# Patient Record
Sex: Female | Born: 1935 | Race: White | Hispanic: No | Marital: Married | Smoking: Never smoker
Health system: Southern US, Community
[De-identification: ages and names within clinical notes are randomized; demographics above are authoritative.]

## PROBLEM LIST (undated history)

## (undated) DIAGNOSIS — C50919 Malignant neoplasm of unspecified site of unspecified female breast: Secondary | ICD-10-CM

## (undated) DIAGNOSIS — E079 Disorder of thyroid, unspecified: Secondary | ICD-10-CM

## (undated) DIAGNOSIS — I82409 Acute embolism and thrombosis of unspecified deep veins of unspecified lower extremity: Secondary | ICD-10-CM

## (undated) DIAGNOSIS — Z853 Personal history of malignant neoplasm of breast: Secondary | ICD-10-CM

## (undated) DIAGNOSIS — M1990 Unspecified osteoarthritis, unspecified site: Secondary | ICD-10-CM

## (undated) DIAGNOSIS — I10 Essential (primary) hypertension: Secondary | ICD-10-CM

## (undated) DIAGNOSIS — I2699 Other pulmonary embolism without acute cor pulmonale: Secondary | ICD-10-CM

## (undated) DIAGNOSIS — I4891 Unspecified atrial fibrillation: Secondary | ICD-10-CM

## (undated) DIAGNOSIS — C801 Malignant (primary) neoplasm, unspecified: Secondary | ICD-10-CM

## (undated) DIAGNOSIS — C50419 Malignant neoplasm of upper-outer quadrant of unspecified female breast: Secondary | ICD-10-CM

## (undated) DIAGNOSIS — F0390 Unspecified dementia without behavioral disturbance: Secondary | ICD-10-CM

## (undated) DIAGNOSIS — E119 Type 2 diabetes mellitus without complications: Secondary | ICD-10-CM

## (undated) HISTORY — DX: Unspecified dementia without behavioral disturbance: F03.90

## (undated) HISTORY — DX: Essential (primary) hypertension: I10

## (undated) HISTORY — DX: Type 2 diabetes mellitus without complications: E11.9

## (undated) HISTORY — PX: APPENDECTOMY: SHX54

## (undated) HISTORY — DX: Disorder of thyroid, unspecified: E07.9

## (undated) HISTORY — DX: Malignant neoplasm of unspecified site of unspecified female breast: C50.919

## (undated) HISTORY — DX: Acute embolism and thrombosis of unspecified deep veins of unspecified lower extremity: I82.409

## (undated) HISTORY — PX: ABDOMINAL HYSTERECTOMY: SHX81

## (undated) HISTORY — DX: Malignant (primary) neoplasm, unspecified: C80.1

## (undated) HISTORY — PX: CHOLECYSTECTOMY: SHX55

## (undated) HISTORY — DX: Malignant neoplasm of upper-outer quadrant of unspecified female breast: C50.419

## (undated) HISTORY — DX: Personal history of malignant neoplasm of breast: Z85.3

## (undated) HISTORY — DX: Unspecified atrial fibrillation: I48.91

## (undated) HISTORY — DX: Other pulmonary embolism without acute cor pulmonale: I26.99

## (undated) HISTORY — DX: Unspecified osteoarthritis, unspecified site: M19.90

---

## 1987-02-12 DIAGNOSIS — C50919 Malignant neoplasm of unspecified site of unspecified female breast: Secondary | ICD-10-CM

## 1987-02-12 HISTORY — PX: BREAST BIOPSY: SHX20

## 1987-02-12 HISTORY — PX: BREAST SURGERY: SHX581

## 1987-02-12 HISTORY — DX: Malignant neoplasm of unspecified site of unspecified female breast: C50.919

## 2005-03-17 ENCOUNTER — Other Ambulatory Visit: Payer: Self-pay

## 2005-03-17 ENCOUNTER — Emergency Department: Payer: Self-pay | Admitting: Emergency Medicine

## 2005-04-24 ENCOUNTER — Ambulatory Visit: Payer: Self-pay | Admitting: Family Medicine

## 2005-06-11 ENCOUNTER — Encounter: Admission: RE | Admit: 2005-06-11 | Discharge: 2005-06-11 | Payer: Self-pay | Admitting: Neurological Surgery

## 2005-06-27 ENCOUNTER — Ambulatory Visit: Payer: Self-pay | Admitting: Pain Medicine

## 2005-07-11 ENCOUNTER — Ambulatory Visit: Payer: Self-pay | Admitting: Pain Medicine

## 2005-07-16 ENCOUNTER — Ambulatory Visit: Payer: Self-pay | Admitting: Cardiology

## 2005-07-25 ENCOUNTER — Ambulatory Visit: Payer: Self-pay | Admitting: Pain Medicine

## 2005-09-19 ENCOUNTER — Inpatient Hospital Stay: Payer: Self-pay | Admitting: Orthopedic Surgery

## 2005-12-18 ENCOUNTER — Ambulatory Visit: Payer: Self-pay | Admitting: Pain Medicine

## 2005-12-19 ENCOUNTER — Ambulatory Visit: Payer: Self-pay | Admitting: Pain Medicine

## 2006-01-07 ENCOUNTER — Ambulatory Visit: Payer: Self-pay | Admitting: Physician Assistant

## 2006-01-21 ENCOUNTER — Ambulatory Visit: Payer: Self-pay | Admitting: Pain Medicine

## 2006-02-16 ENCOUNTER — Inpatient Hospital Stay: Payer: Self-pay | Admitting: Internal Medicine

## 2006-02-16 ENCOUNTER — Other Ambulatory Visit: Payer: Self-pay

## 2006-03-01 ENCOUNTER — Emergency Department: Payer: Self-pay | Admitting: Internal Medicine

## 2006-10-17 ENCOUNTER — Ambulatory Visit: Payer: Self-pay | Admitting: Neurology

## 2006-12-08 ENCOUNTER — Ambulatory Visit: Payer: Self-pay | Admitting: Family Medicine

## 2007-02-07 ENCOUNTER — Observation Stay: Payer: Self-pay | Admitting: Internal Medicine

## 2007-06-15 ENCOUNTER — Emergency Department: Payer: Self-pay | Admitting: Emergency Medicine

## 2007-06-28 ENCOUNTER — Observation Stay: Payer: Self-pay | Admitting: Internal Medicine

## 2007-06-28 ENCOUNTER — Other Ambulatory Visit: Payer: Self-pay

## 2008-09-19 ENCOUNTER — Ambulatory Visit: Payer: Self-pay | Admitting: Family Medicine

## 2009-02-11 DIAGNOSIS — C801 Malignant (primary) neoplasm, unspecified: Secondary | ICD-10-CM

## 2009-02-11 DIAGNOSIS — F0390 Unspecified dementia without behavioral disturbance: Secondary | ICD-10-CM

## 2009-02-11 HISTORY — PX: BREAST SURGERY: SHX581

## 2009-02-11 HISTORY — DX: Malignant (primary) neoplasm, unspecified: C80.1

## 2009-02-11 HISTORY — DX: Unspecified dementia, unspecified severity, without behavioral disturbance, psychotic disturbance, mood disturbance, and anxiety: F03.90

## 2009-05-30 ENCOUNTER — Emergency Department: Payer: Self-pay | Admitting: Emergency Medicine

## 2009-07-17 ENCOUNTER — Ambulatory Visit: Payer: Self-pay | Admitting: Medical

## 2009-08-22 ENCOUNTER — Emergency Department: Payer: Self-pay | Admitting: Emergency Medicine

## 2009-08-31 ENCOUNTER — Inpatient Hospital Stay: Payer: Self-pay | Admitting: General Surgery

## 2009-09-04 LAB — PATHOLOGY REPORT

## 2009-11-25 ENCOUNTER — Emergency Department: Payer: Self-pay | Admitting: Emergency Medicine

## 2009-12-02 ENCOUNTER — Emergency Department: Payer: Self-pay | Admitting: Internal Medicine

## 2009-12-04 ENCOUNTER — Emergency Department: Payer: Self-pay | Admitting: Emergency Medicine

## 2009-12-05 ENCOUNTER — Emergency Department: Payer: Self-pay | Admitting: Emergency Medicine

## 2010-01-31 ENCOUNTER — Emergency Department: Payer: Self-pay | Admitting: Emergency Medicine

## 2010-03-15 ENCOUNTER — Emergency Department: Payer: Self-pay | Admitting: Unknown Physician Specialty

## 2010-06-01 ENCOUNTER — Emergency Department: Payer: Self-pay | Admitting: *Deleted

## 2010-06-10 ENCOUNTER — Emergency Department: Payer: Self-pay | Admitting: Emergency Medicine

## 2010-07-28 ENCOUNTER — Emergency Department: Payer: Self-pay | Admitting: Emergency Medicine

## 2010-08-30 ENCOUNTER — Emergency Department: Payer: Self-pay | Admitting: Emergency Medicine

## 2010-09-19 ENCOUNTER — Emergency Department: Payer: Self-pay | Admitting: Emergency Medicine

## 2010-10-19 ENCOUNTER — Emergency Department: Payer: Self-pay | Admitting: Emergency Medicine

## 2011-06-15 ENCOUNTER — Emergency Department: Payer: Self-pay | Admitting: Emergency Medicine

## 2011-06-15 LAB — BASIC METABOLIC PANEL
BUN: 23 mg/dL — ABNORMAL HIGH (ref 7–18)
Calcium, Total: 8.4 mg/dL — ABNORMAL LOW (ref 8.5–10.1)
Creatinine: 0.91 mg/dL (ref 0.60–1.30)
EGFR (African American): >60
Glucose: 97 mg/dL (ref 65–99)
Osmolality: 290 (ref 275–301)
Potassium: 4.7 mmol/L (ref 3.5–5.1)

## 2011-06-15 LAB — CBC
HGB: 11.4 g/dL — ABNORMAL LOW (ref 12.0–16.0)
MCHC: 32.5 g/dL (ref 32.0–36.0)
MCV: 84 fL (ref 80–100)
RBC: 4.15 10*6/uL (ref 3.80–5.20)
RDW: 17 % — ABNORMAL HIGH (ref 11.5–14.5)
WBC: 4 10*3/uL (ref 3.6–11.0)

## 2011-06-15 LAB — DIGOXIN LEVEL: Digoxin: 0.98 ng/mL

## 2011-06-18 ENCOUNTER — Emergency Department: Payer: Self-pay | Admitting: Emergency Medicine

## 2011-06-30 ENCOUNTER — Emergency Department: Payer: Self-pay | Admitting: Emergency Medicine

## 2011-06-30 LAB — COMPREHENSIVE METABOLIC PANEL
Albumin: 3.3 g/dL — ABNORMAL LOW (ref 3.4–5.0)
Anion Gap: 10 (ref 7–16)
BUN: 21 mg/dL — ABNORMAL HIGH (ref 7–18)
Bilirubin,Total: 0.3 mg/dL (ref 0.2–1.0)
Chloride: 106 mmol/L (ref 98–107)
EGFR (African American): >60
Glucose: 132 mg/dL — ABNORMAL HIGH (ref 65–99)
Potassium: 4.3 mmol/L (ref 3.5–5.1)
SGPT (ALT): 19 U/L
Sodium: 144 mmol/L (ref 136–145)

## 2011-06-30 LAB — CBC
MCHC: 33.2 g/dL (ref 32.0–36.0)
Platelet: 135 10*3/uL — ABNORMAL LOW (ref 150–440)
RBC: 4.17 10*6/uL (ref 3.80–5.20)
RDW: 16.5 % — ABNORMAL HIGH (ref 11.5–14.5)

## 2011-06-30 LAB — CK TOTAL AND CKMB (NOT AT ARMC): CK-MB: 0.6 ng/mL (ref 0.5–3.6)

## 2011-12-10 IMAGING — CR DG HUMERUS 2V *L*
1 series · 2 of 2 positions shown · non-contrast
Comparison: none

REASON FOR EXAM: fall
COMMENTS:

PROCEDURE:     DXR - DXR HUMERUS LEFT  - December 02, 2009  [DATE]
RESULT:     There is no evidence of fracture, dislocation, or malalignment.

[Series 1: view not recorded · 0.17mm/px · 2 of 2 slices shown]
[im 1/2]
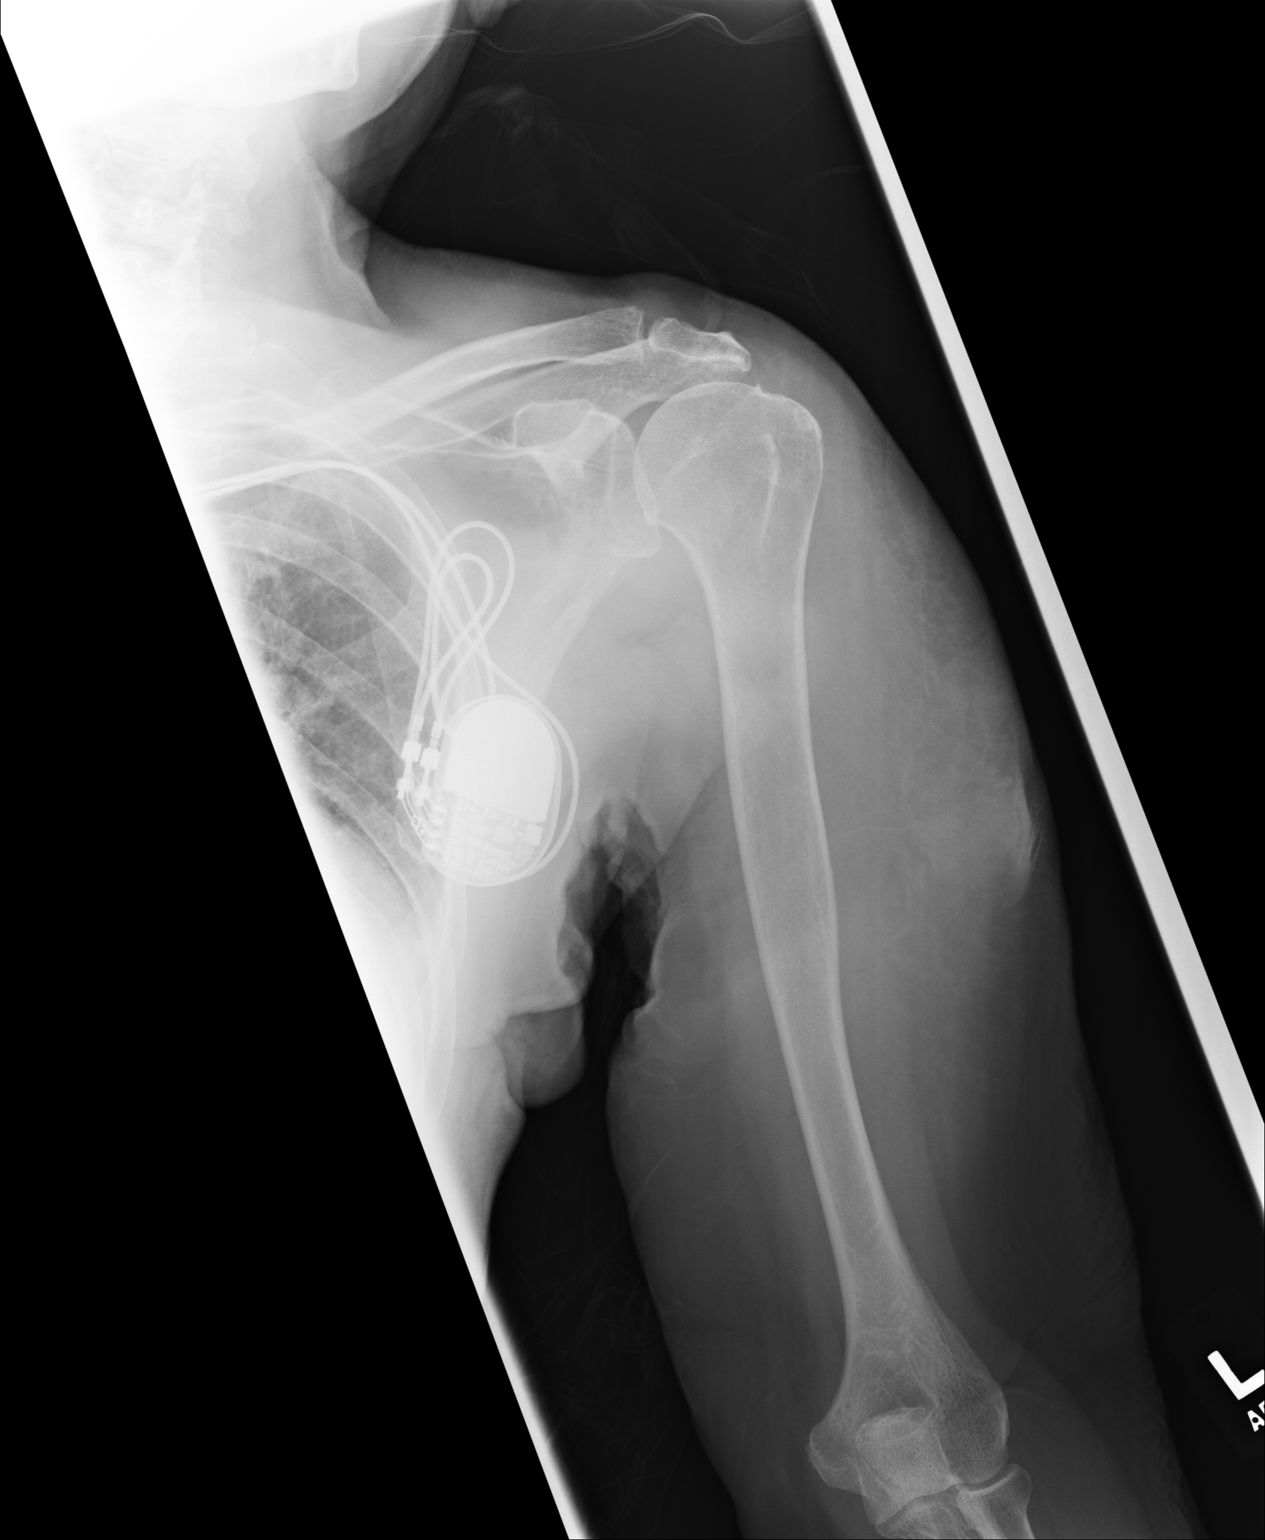
[im 2/2]
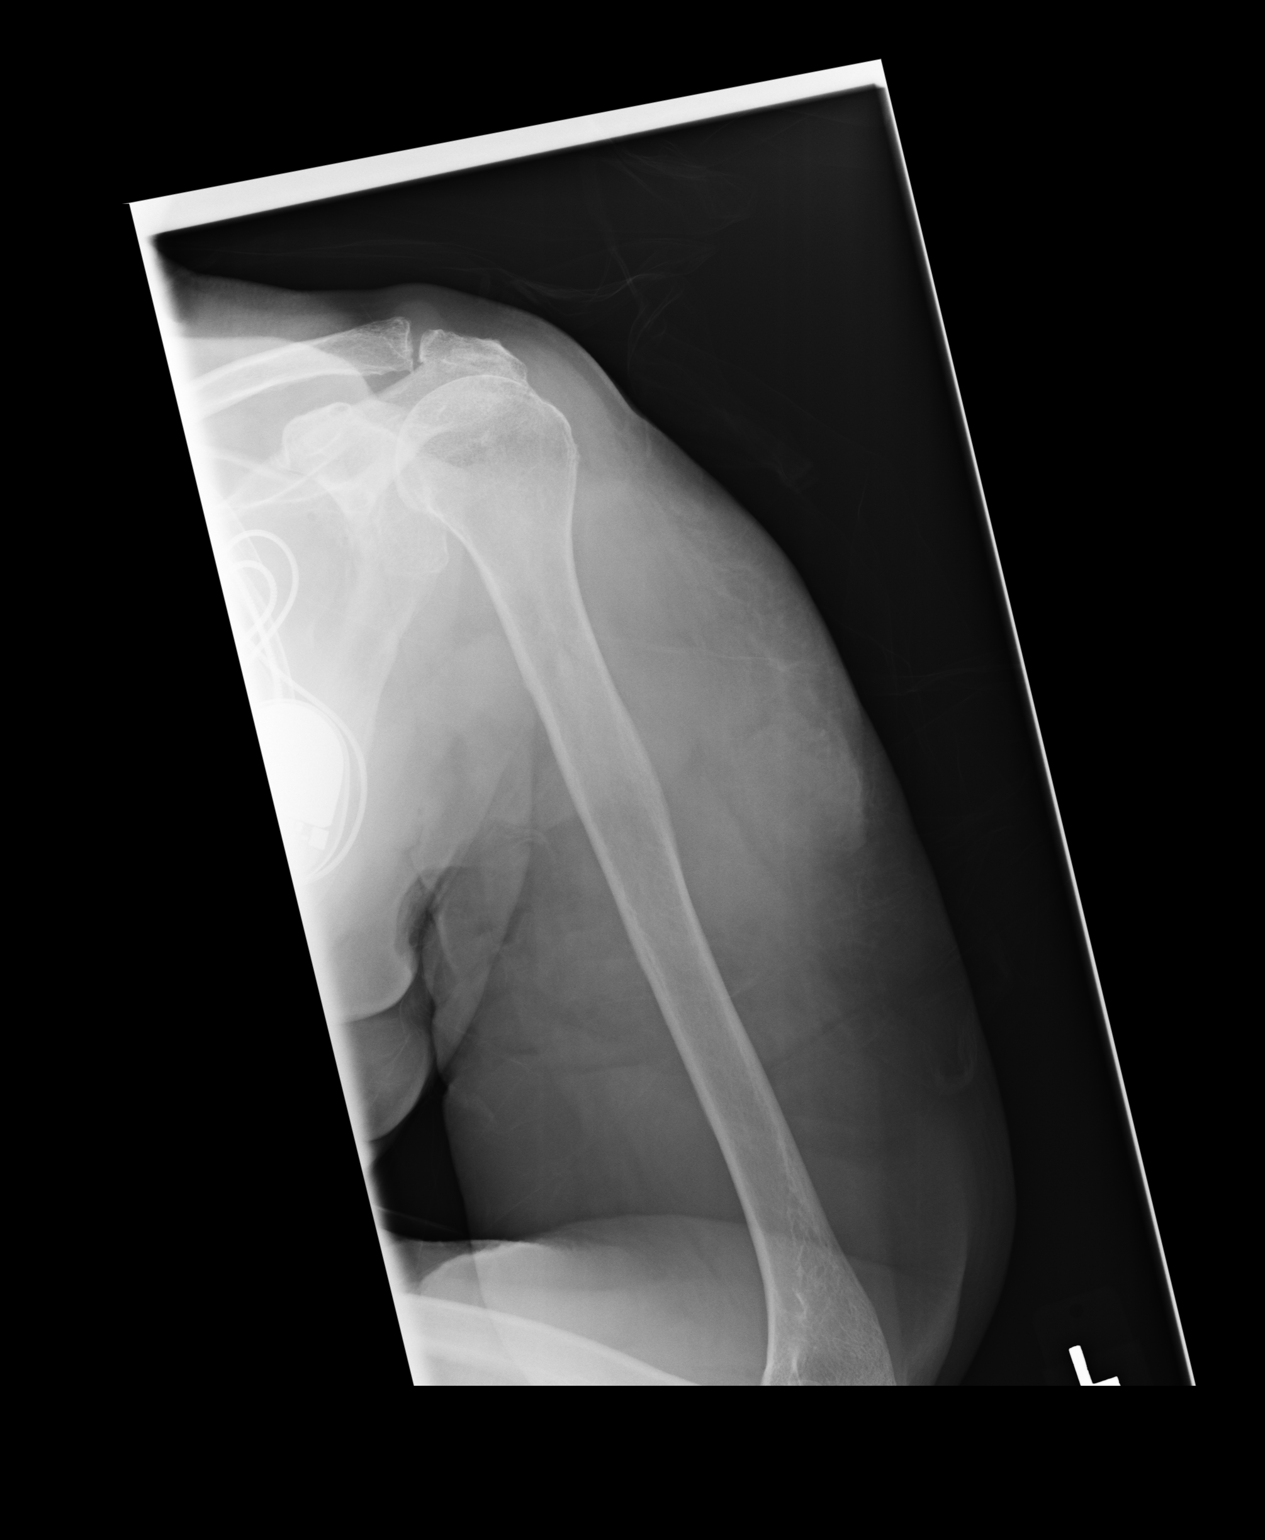

[2 of 2 positions shown; findings below may reference images not displayed]

IMPRESSION: 1. No evidence of acute abnormalities.
2. If there are persistent complaints of pain or persistent clinical
concern, a repeat evaluation in 7-10 days is recommended if clinically
warranted.

## 2011-12-12 IMAGING — CR DG HUMERUS 2V *L*
1 series · 2 of 2 positions shown · non-contrast
Comparison: none

REASON FOR EXAM: s/p fall eval for fx
COMMENTS:

PROCEDURE:     DXR - DXR HUMERUS LEFT  - December 04, 2009  [DATE]
RESULT:     Comparison is made to a prior exam of 12/02/2009.
No fracture, dislocation or other acute bony abnormality is identified.

[Series 1: view not recorded · 0.17mm/px · 2 of 2 slices shown]
[im 1/2]
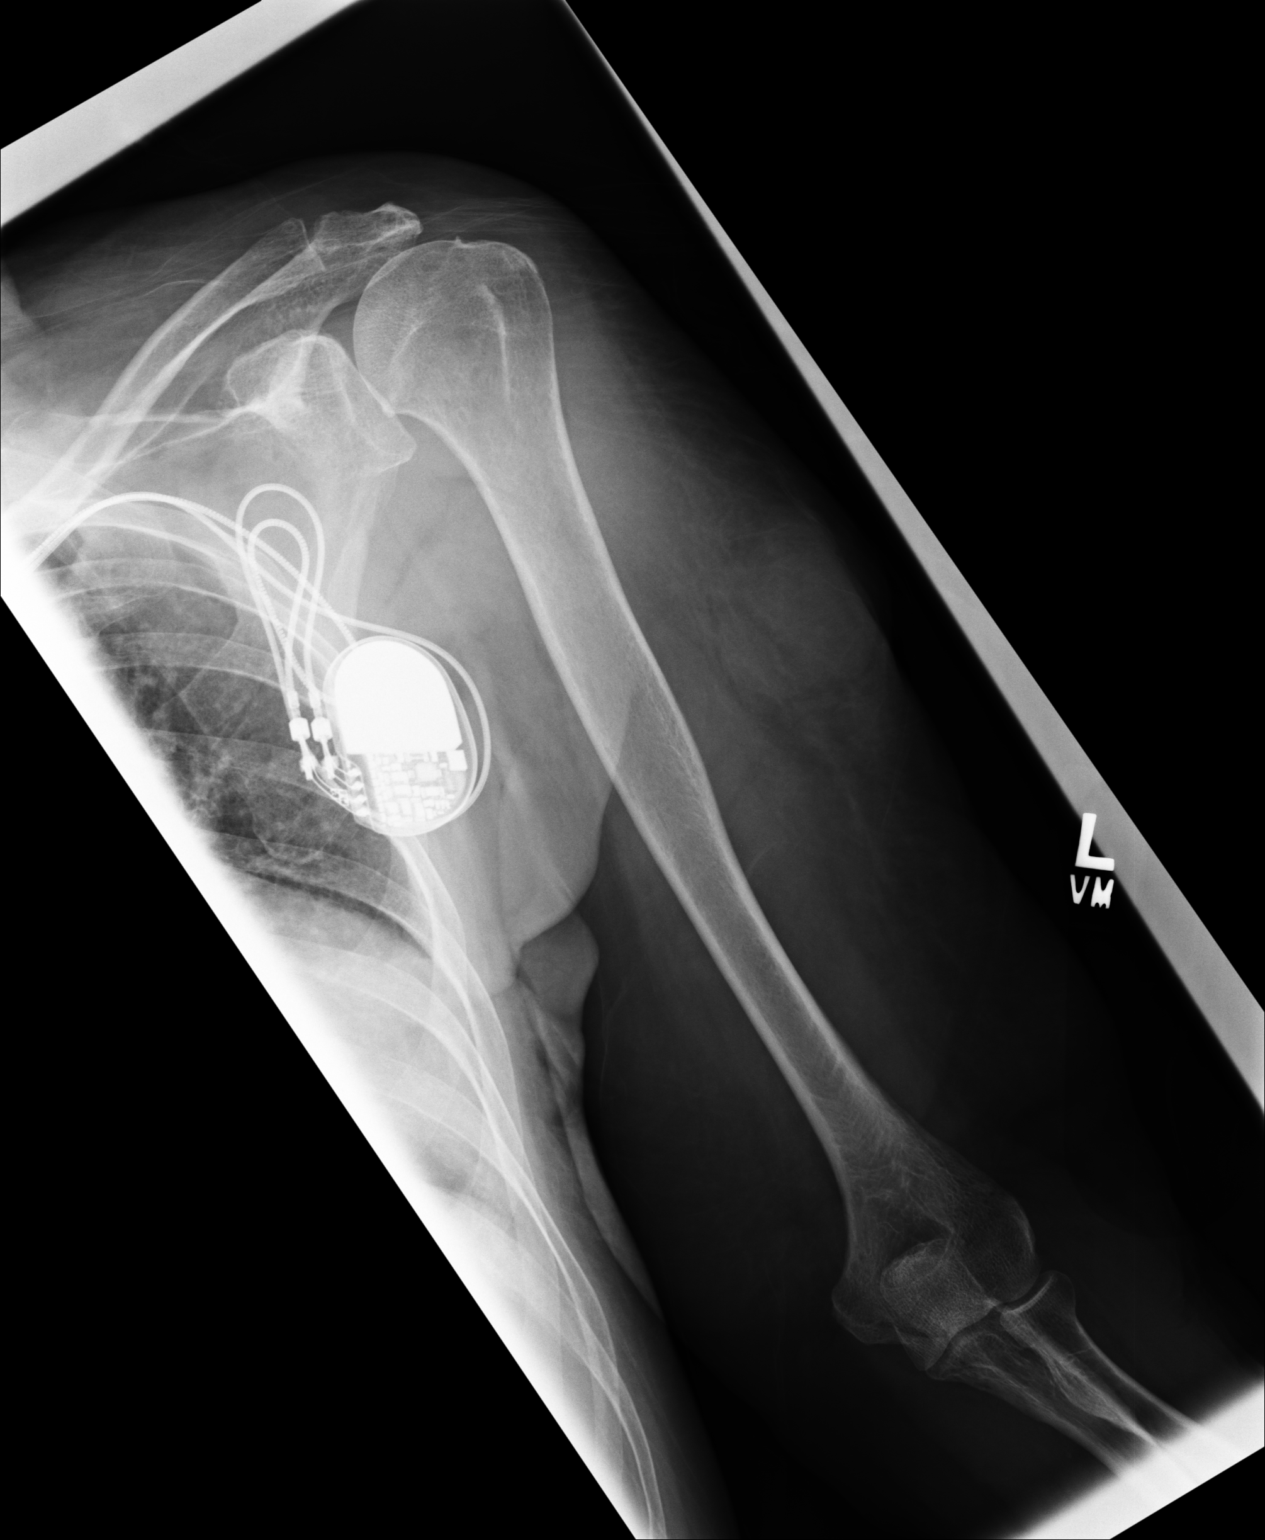
[im 2/2]
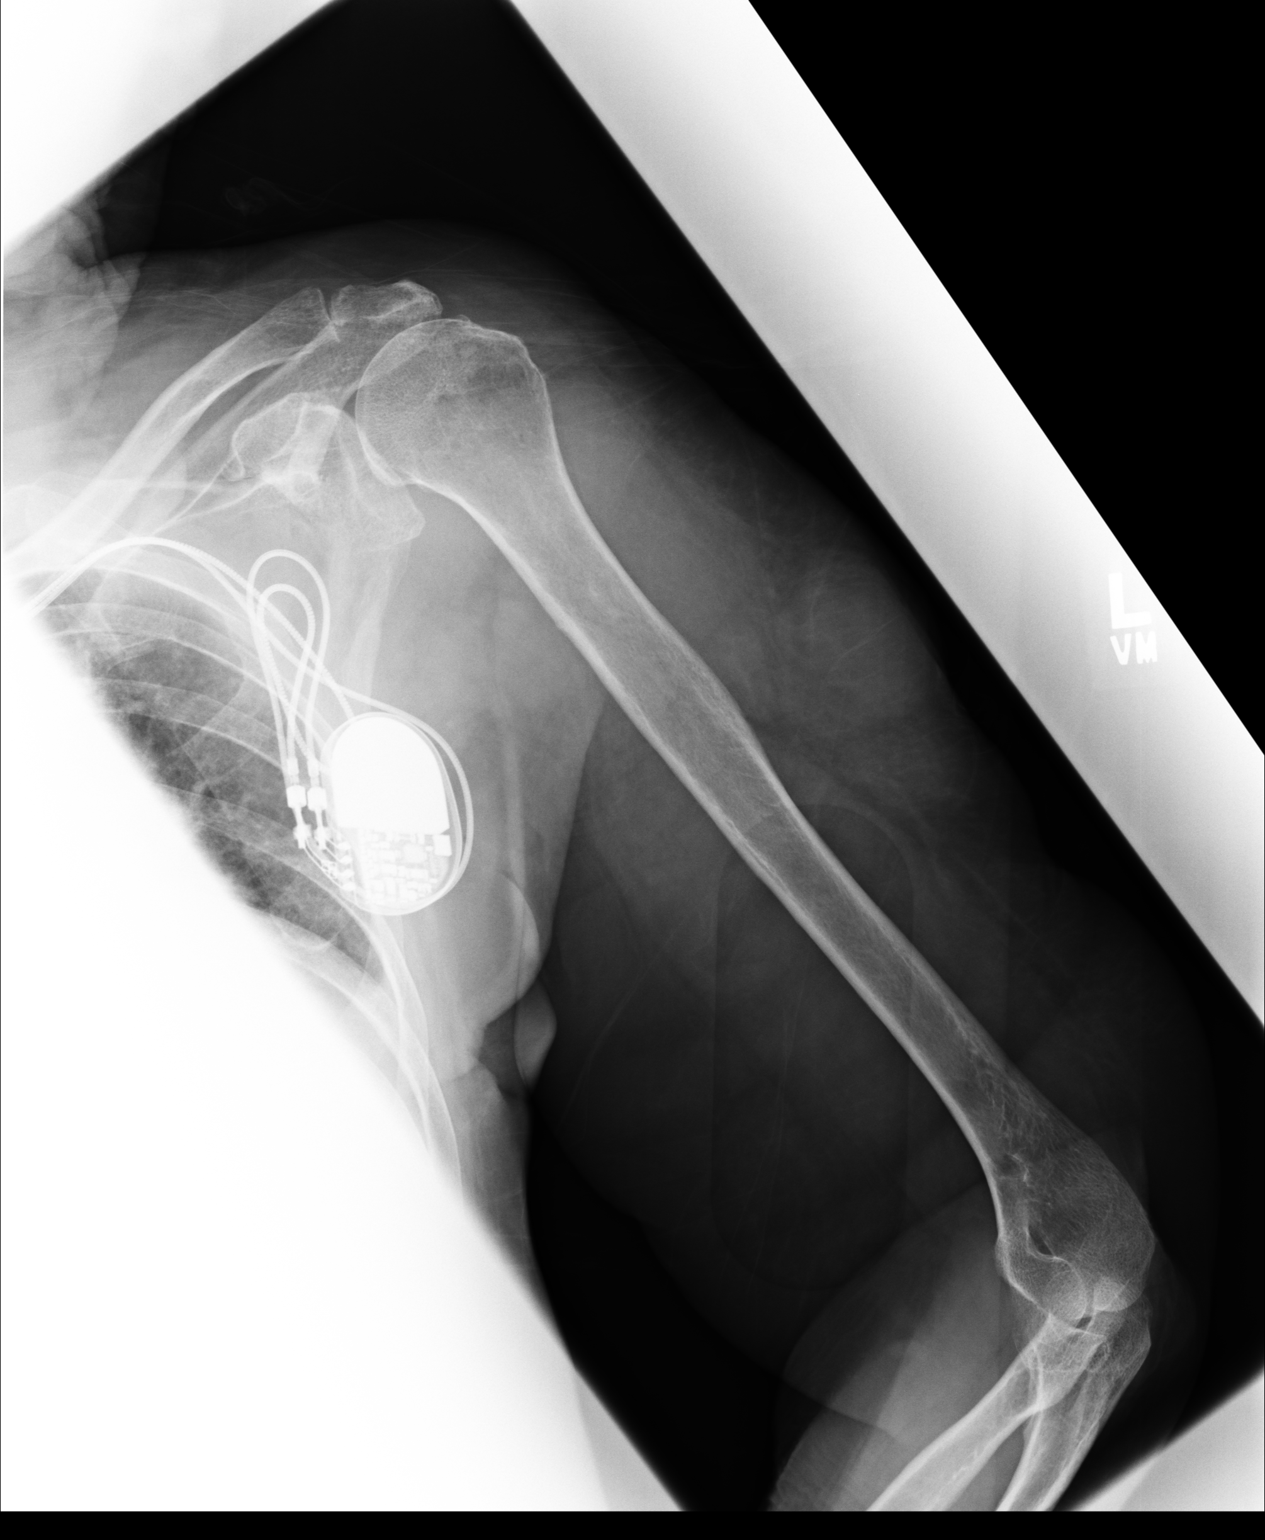

[2 of 2 positions shown; findings below may reference images not displayed]

IMPRESSION: No significant osseous abnormalities are noted.

## 2012-03-15 ENCOUNTER — Emergency Department: Payer: Self-pay | Admitting: Emergency Medicine

## 2012-03-15 LAB — URINALYSIS, COMPLETE
Blood: NEGATIVE
Glucose,UR: NEGATIVE mg/dL (ref 0–75)
Hyaline Cast: 2
Ketone: NEGATIVE
Nitrite: POSITIVE
Ph: 5 (ref 4.5–8.0)
RBC,UR: 12 /HPF (ref 0–5)
Specific Gravity: 1.017 (ref 1.003–1.030)
WBC UR: 251 /HPF (ref 0–5)

## 2012-03-15 LAB — COMPREHENSIVE METABOLIC PANEL
Albumin: 3 g/dL — ABNORMAL LOW (ref 3.4–5.0)
Bilirubin,Total: 0.3 mg/dL (ref 0.2–1.0)
Calcium, Total: 8 mg/dL — ABNORMAL LOW (ref 8.5–10.1)
Co2: 26 mmol/L (ref 21–32)
Creatinine: 0.97 mg/dL (ref 0.60–1.30)
Potassium: 4 mmol/L (ref 3.5–5.1)
Total Protein: 6.4 g/dL (ref 6.4–8.2)

## 2012-03-15 LAB — CBC
MCV: 86 fL (ref 80–100)
RDW: 16.9 % — ABNORMAL HIGH (ref 11.5–14.5)
WBC: 3.2 10*3/uL — ABNORMAL LOW (ref 3.6–11.0)

## 2012-04-30 ENCOUNTER — Emergency Department: Payer: Self-pay | Admitting: Emergency Medicine

## 2012-05-20 ENCOUNTER — Emergency Department: Payer: Self-pay | Admitting: Emergency Medicine

## 2012-05-23 ENCOUNTER — Emergency Department: Payer: Self-pay | Admitting: Emergency Medicine

## 2012-05-23 LAB — COMPREHENSIVE METABOLIC PANEL
Albumin: 3.3 g/dL — ABNORMAL LOW (ref 3.4–5.0)
Anion Gap: 7 (ref 7–16)
Bilirubin,Total: 0.2 mg/dL (ref 0.2–1.0)
Chloride: 106 mmol/L (ref 98–107)
EGFR (African American): >60
Glucose: 201 mg/dL — ABNORMAL HIGH (ref 65–99)
Osmolality: 295 (ref 275–301)
Potassium: 4.4 mmol/L (ref 3.5–5.1)
SGOT(AST): 13 U/L — ABNORMAL LOW (ref 15–37)
SGPT (ALT): 14 U/L (ref 12–78)
Total Protein: 6.8 g/dL (ref 6.4–8.2)

## 2012-05-23 LAB — DIGOXIN LEVEL: Digoxin: 0.83 ng/mL

## 2012-05-23 LAB — CBC
HCT: 38.5 % (ref 35.0–47.0)
MCH: 28.5 pg (ref 26.0–34.0)
RBC: 4.38 10*6/uL (ref 3.80–5.20)

## 2012-05-23 LAB — TROPONIN I: Troponin-I: <0.02 ng/mL

## 2012-07-09 ENCOUNTER — Telehealth: Payer: Self-pay | Admitting: *Deleted

## 2012-07-09 NOTE — Telephone Encounter (Signed)
Notified son as instructed.  He states as far as he knows she is doing "good".  States he will check with his sister as well.  Patient is at [facility] and her dementia is worse.  Appreciates our call.  Aware to call us if we can be of assistance in any way.

## 2012-07-09 NOTE — Telephone Encounter (Signed)
Message copied by Currie Paris on Thu Jul 09, 2012 10:01 AM ------      Message from: Deercroft, Merrily Pew      Created: Thu Jul 09, 2012  9:50 AM       Patient last seen July 2013, 2 years post mastectomy. Contact son and see if she is having any problems re: breast/ mastectomy. If not, no further f/u is needed. Thanks.  ------

## 2012-07-22 ENCOUNTER — Emergency Department: Payer: Self-pay | Admitting: Internal Medicine

## 2013-05-12 ENCOUNTER — Ambulatory Visit: Payer: Self-pay | Admitting: Internal Medicine

## 2013-05-28 ENCOUNTER — Inpatient Hospital Stay: Payer: Self-pay | Admitting: Specialist

## 2013-05-28 LAB — URINALYSIS, COMPLETE
BACTERIA: NONE SEEN
Bilirubin,UR: NEGATIVE
Blood: NEGATIVE
GLUCOSE, UR: NEGATIVE mg/dL (ref 0–75)
Ketone: NEGATIVE
LEUKOCYTE ESTERASE: NEGATIVE
Nitrite: NEGATIVE
Ph: 6 (ref 4.5–8.0)
Protein: NEGATIVE
RBC,UR: 4 /HPF (ref 0–5)
SPECIFIC GRAVITY: 1.024 (ref 1.003–1.030)
WBC UR: 1 /HPF (ref 0–5)

## 2013-05-28 LAB — TROPONIN I
TROPONIN-I: 0.08 ng/mL — AB
Troponin-I: 0.07 ng/mL — ABNORMAL HIGH
Troponin-I: 0.07 ng/mL — ABNORMAL HIGH
Troponin-I: 0.08 ng/mL — ABNORMAL HIGH

## 2013-05-28 LAB — CBC
HCT: 46.5 % (ref 35.0–47.0)
HGB: 14.9 g/dL (ref 12.0–16.0)
MCH: 29.6 pg (ref 26.0–34.0)
MCHC: 32.1 g/dL (ref 32.0–36.0)
MCV: 92 fL (ref 80–100)
PLATELETS: 111 10*3/uL — AB (ref 150–440)
RBC: 5.04 10*6/uL (ref 3.80–5.20)
RDW: 17.1 % — AB (ref 11.5–14.5)
WBC: 7.3 10*3/uL (ref 3.6–11.0)

## 2013-05-28 LAB — SODIUM
SODIUM: 151 mmol/L — AB (ref 136–145)
Sodium: 150 mmol/L — ABNORMAL HIGH (ref 136–145)

## 2013-05-28 LAB — COMPREHENSIVE METABOLIC PANEL
ALBUMIN: 2.9 g/dL — AB (ref 3.4–5.0)
ANION GAP: 2 — AB (ref 7–16)
AST: 72 U/L — AB (ref 15–37)
Alkaline Phosphatase: 51 U/L
BUN: 25 mg/dL — ABNORMAL HIGH (ref 7–18)
Bilirubin,Total: 0.6 mg/dL (ref 0.2–1.0)
CALCIUM: 8.5 mg/dL (ref 8.5–10.1)
CO2: 34 mmol/L — AB (ref 21–32)
CREATININE: 0.51 mg/dL — AB (ref 0.60–1.30)
Chloride: 114 mmol/L — ABNORMAL HIGH (ref 98–107)
EGFR (African American): >60
EGFR (Non-African Amer.): >60
GLUCOSE: 152 mg/dL — AB (ref 65–99)
OSMOLALITY: 305 (ref 275–301)
Potassium: 4.8 mmol/L (ref 3.5–5.1)
SGPT (ALT): 33 U/L (ref 12–78)
SODIUM: 150 mmol/L — AB (ref 136–145)
TOTAL PROTEIN: 7 g/dL (ref 6.4–8.2)

## 2013-05-28 LAB — CK-MB
CK-MB: 5.1 ng/mL — AB (ref 0.5–3.6)
CK-MB: 4.3 ng/mL — ABNORMAL HIGH (ref 0.5–3.6)

## 2013-05-28 LAB — PROTIME-INR
INR: 1.1
Prothrombin Time: 13.8 secs (ref 11.5–14.7)

## 2013-05-28 LAB — CK TOTAL AND CKMB (NOT AT ARMC)
CK, TOTAL: 251 U/L — AB
CK-MB: 5.3 ng/mL — AB (ref 0.5–3.6)

## 2013-05-29 LAB — CBC WITH DIFFERENTIAL/PLATELET
BASOS ABS: 0 10*3/uL (ref 0.0–0.1)
Basophil %: 0.8 %
EOS ABS: 0 10*3/uL (ref 0.0–0.7)
EOS PCT: 0.5 %
HCT: 40.1 % (ref 35.0–47.0)
HGB: 12.7 g/dL (ref 12.0–16.0)
LYMPHS ABS: 1.1 10*3/uL (ref 1.0–3.6)
LYMPHS PCT: 23.5 %
MCH: 29.4 pg (ref 26.0–34.0)
MCHC: 31.7 g/dL — ABNORMAL LOW (ref 32.0–36.0)
MCV: 93 fL (ref 80–100)
Monocyte #: 0.2 x10 3/mm (ref 0.2–0.9)
Monocyte %: 5.5 %
NEUTROS PCT: 69.7 %
Neutrophil #: 3.1 10*3/uL (ref 1.4–6.5)
PLATELETS: 99 10*3/uL — AB (ref 150–440)
RBC: 4.32 10*6/uL (ref 3.80–5.20)
RDW: 16.6 % — AB (ref 11.5–14.5)
WBC: 4.5 10*3/uL (ref 3.6–11.0)

## 2013-05-29 LAB — BASIC METABOLIC PANEL
ANION GAP: 6 — AB (ref 7–16)
BUN: 18 mg/dL (ref 7–18)
CALCIUM: 7.6 mg/dL — AB (ref 8.5–10.1)
CHLORIDE: 111 mmol/L — AB (ref 98–107)
Co2: 30 mmol/L (ref 21–32)
Creatinine: 0.62 mg/dL (ref 0.60–1.30)
EGFR (African American): >60
GLUCOSE: 228 mg/dL — AB (ref 65–99)
OSMOLALITY: 302 (ref 275–301)
POTASSIUM: 3 mmol/L — AB (ref 3.5–5.1)
Sodium: 147 mmol/L — ABNORMAL HIGH (ref 136–145)

## 2013-05-29 LAB — MAGNESIUM: MAGNESIUM: 2.2 mg/dL

## 2013-05-30 LAB — BASIC METABOLIC PANEL
ANION GAP: 4 — AB (ref 7–16)
BUN: 13 mg/dL (ref 7–18)
CHLORIDE: 109 mmol/L — AB (ref 98–107)
CO2: 30 mmol/L (ref 21–32)
Calcium, Total: 7.7 mg/dL — ABNORMAL LOW (ref 8.5–10.1)
Creatinine: 0.64 mg/dL (ref 0.60–1.30)
EGFR (African American): >60
GLUCOSE: 170 mg/dL — AB (ref 65–99)
OSMOLALITY: 289 (ref 275–301)
Potassium: 3.3 mmol/L — ABNORMAL LOW (ref 3.5–5.1)
SODIUM: 143 mmol/L (ref 136–145)

## 2013-06-11 ENCOUNTER — Ambulatory Visit: Payer: Self-pay | Admitting: Internal Medicine

## 2013-08-11 DEATH — deceased

## 2014-05-28 IMAGING — CT CT HEAD WITHOUT CONTRAST
1 series · 16 of 30 positions shown, 20 images · non-contrast
Comparison: none

REASON FOR EXAM: fall with head trauma
COMMENTS:

PROCEDURE:     CT  - CT HEAD WITHOUT CONTRAST  - May 20, 2012  [DATE]
RESULT:     Comparison:  04/30/2012
TECHNIQUE: Multiple axial images from the foramen magnum to the vertex were
obtained without IV contrast.

[Series 2: soft tissue · axial · 0.40mm/px · z∈[+100,+240]mm · 16 of 32 slices shown, 20 images]
[im 2/32  brain]
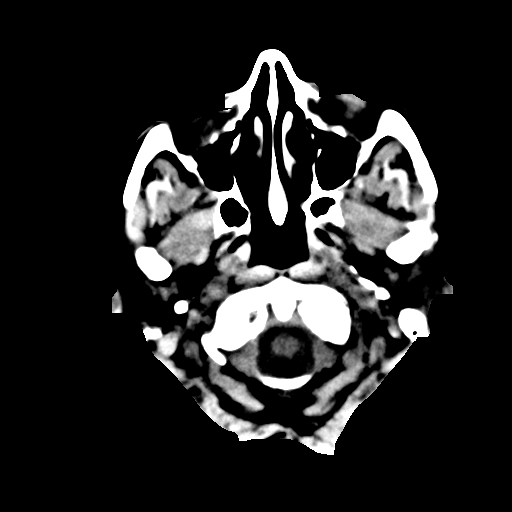
[im 2/32  bone]
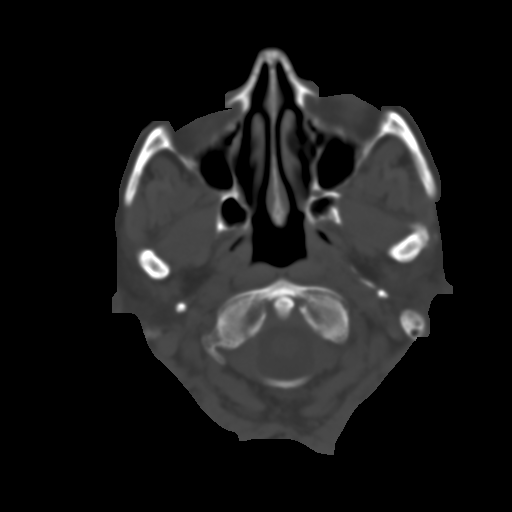
[im 4/32  brain]
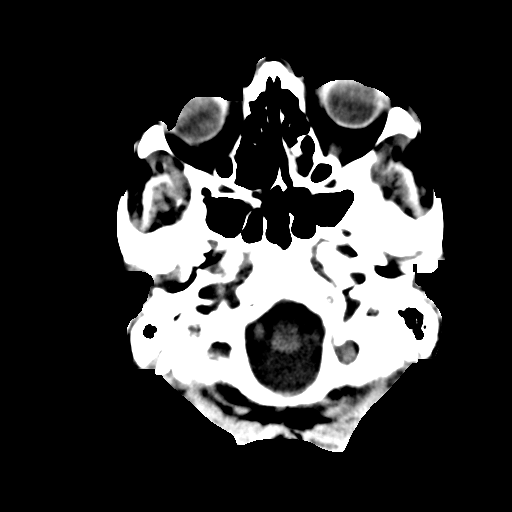
[im 6/32  brain]
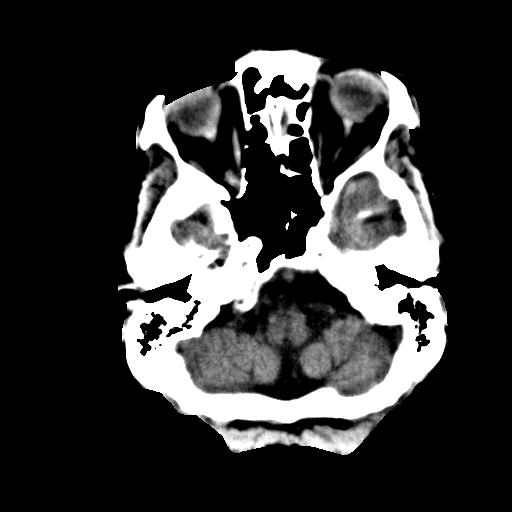
[im 8/32  brain]
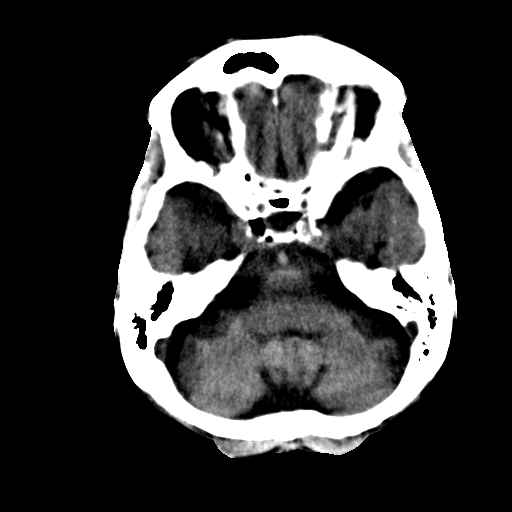
[im 9/32  brain]
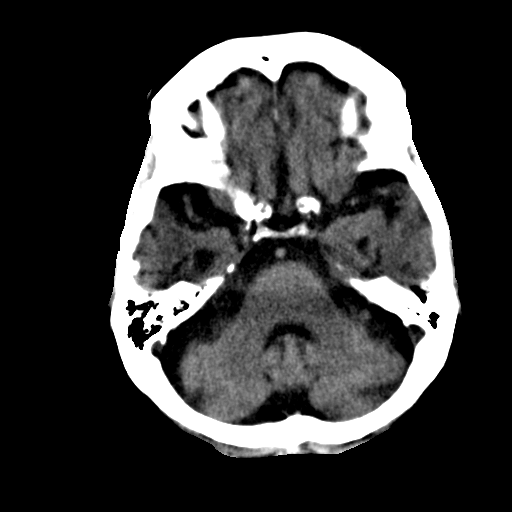
[im 9/32  bone]
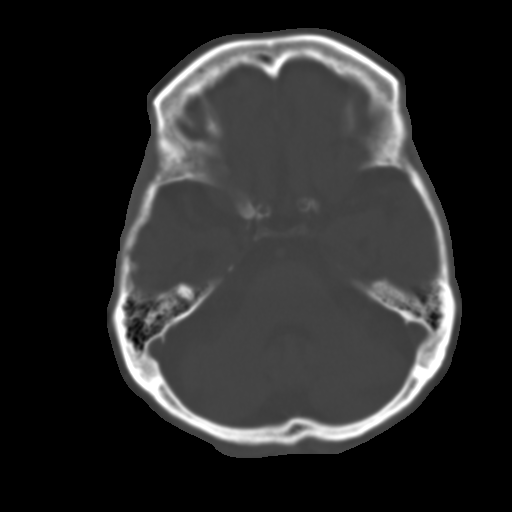
[im 11/32  brain]
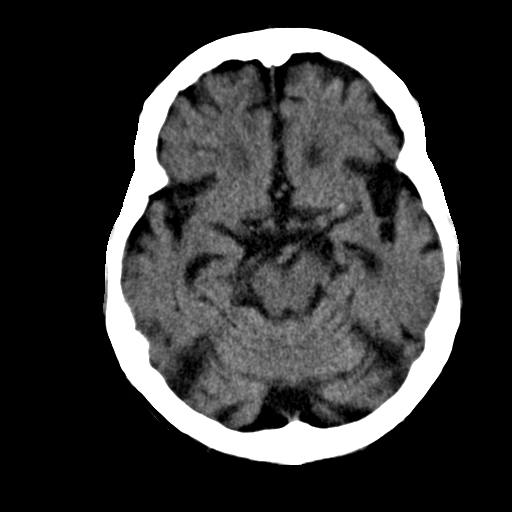
[im 13/32  brain]
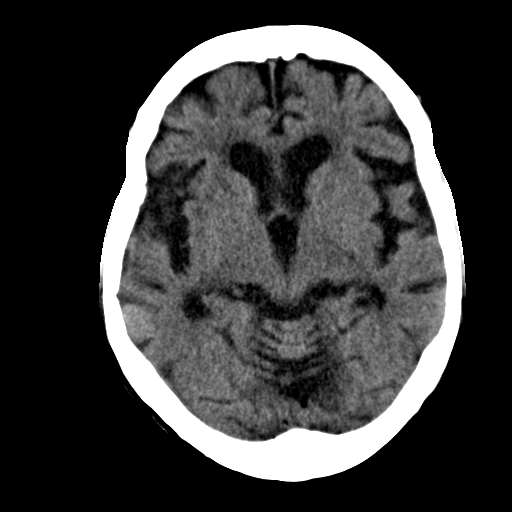
[im 15/32  brain]
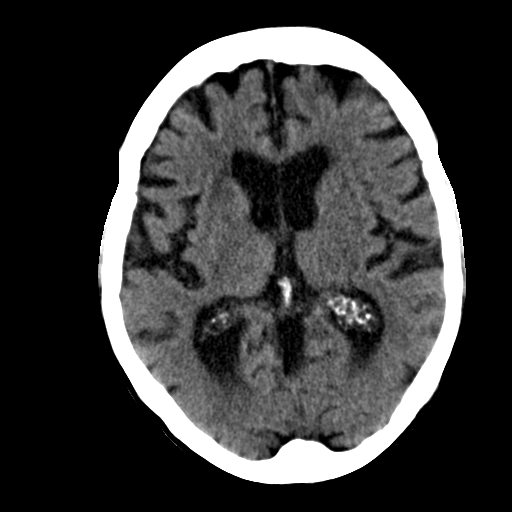
[im 17/32  brain]
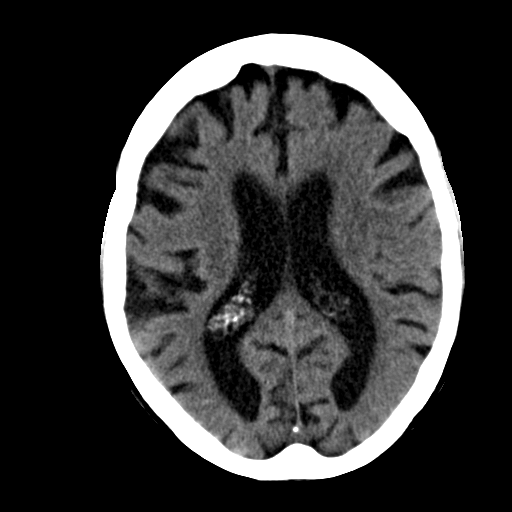
[im 17/32  bone]
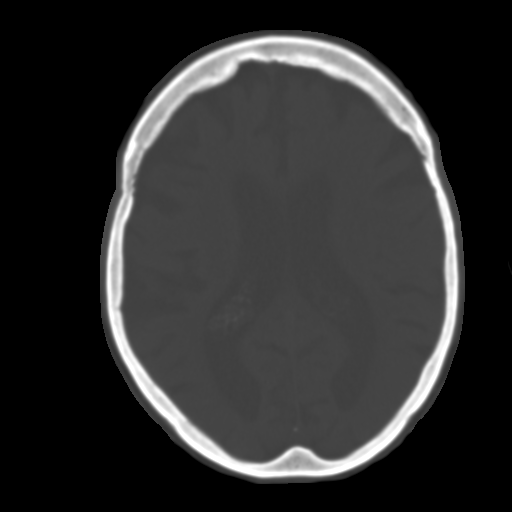
[im 19/32  brain]
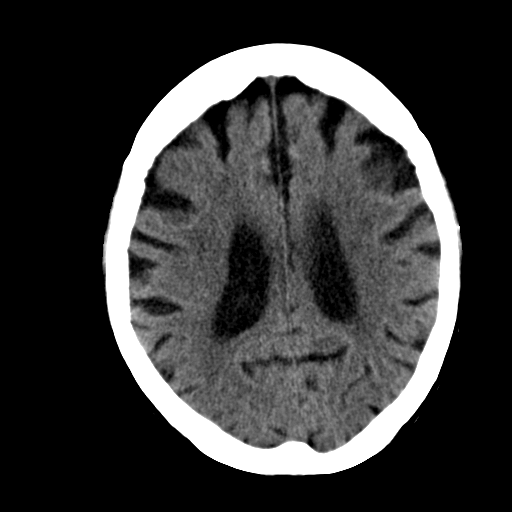
[im 21/32  brain]
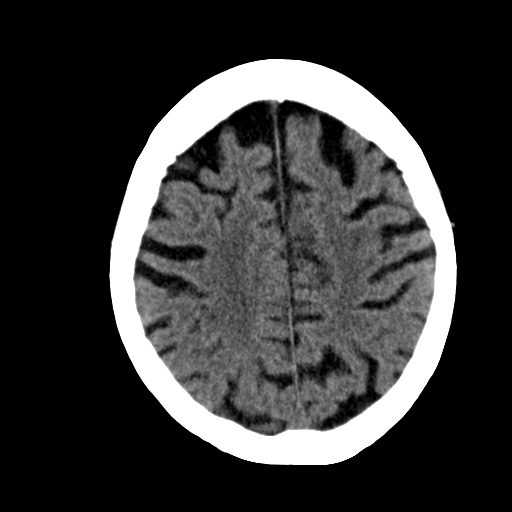
[im 23/32  brain]
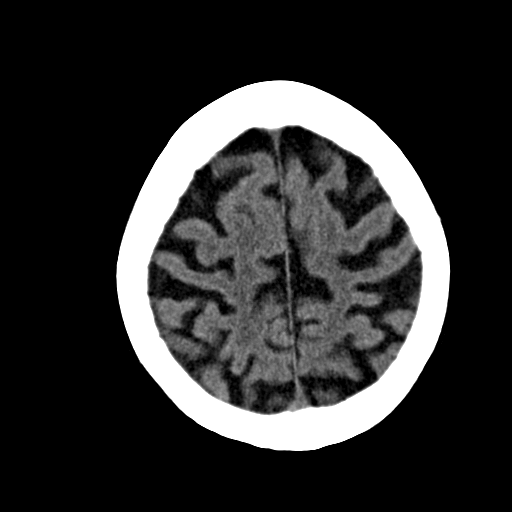
[im 24/32  brain]
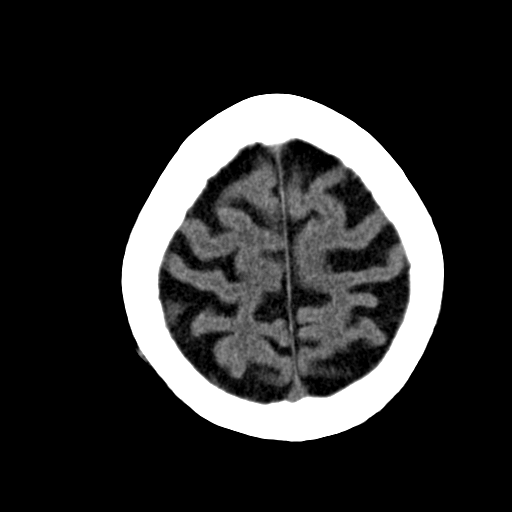
[im 24/32  bone]
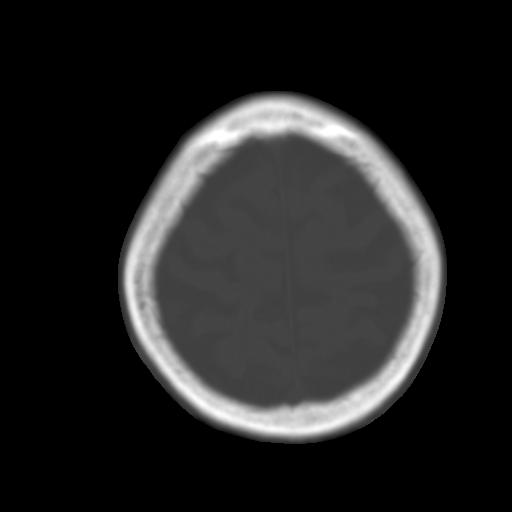
[im 26/32  brain]
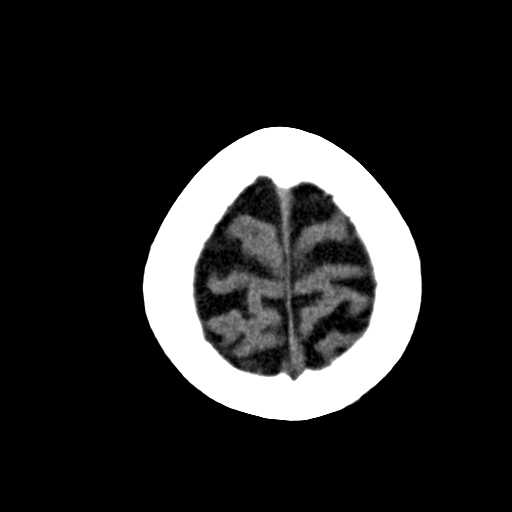
[im 28/32  brain]
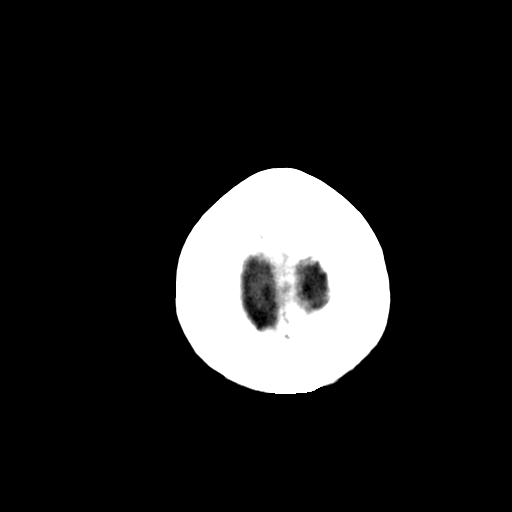
[im 30/32  brain]
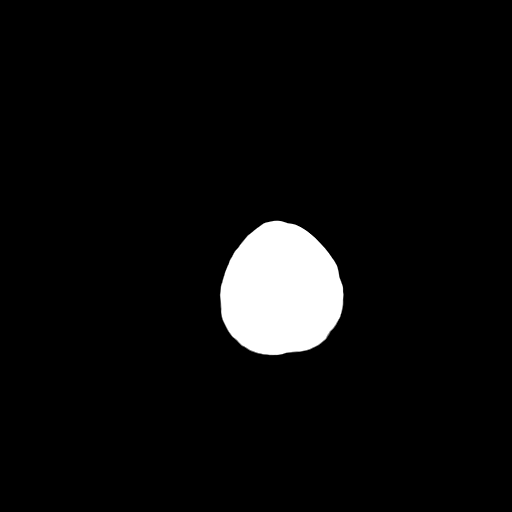

[16 of 30 positions shown; findings below may reference images not displayed]

FINDINGS: There is no evidence for mass effect, midline shift, or extra-axial fluid
collections. There is no evidence for space-occupying lesion, intracranial
hemorrhage, or cortical-based area of infarction. Periventricular and
subcortical hypoattenuation is consistent with chronic small vessel ischemic
disease.

The osseous structures are unremarkable.
IMPRESSION: 1. No acute intracranial process.
2. Chronic small vessel ischemic disease.

## 2014-06-04 NOTE — Discharge Summary (Signed)
PATIENT NAME:  Molly Beasley, Molly Beasley MR#:  [id number] DATE OF BIRTH:  1915/08/11  DATE OF ADMISSION:  05/28/2013 DATE OF DISCHARGE:  05/31/2013  REASON FOR ADMISSION: Changes in mental status, lethargy.   HOSPITAL COURSE: This is a very nice 81 year old female with history of advanced dementia who has been really wheelchair dependent, who apparently has been very incoherent and not able to recognize her family for years. The nursing home sent her because there were some changes in her mental status. They feel like she was more lethargic. When the son came over here, he said that she was about the same all the time. She was noticed to be hypernatremic with a sodium of 150 and she had a slight elevation of troponin which triggered an admission on her.   As far as the problems, lethargy and altered mental status, again this is pretty close or her baseline. She did have some fluids to see if that will help the dehydration. We started with providing some regular fluids then changed it to D5/half normal saline and then to D5 as her hypernatremia was getting worse and right now her sodium is 143.  This has not changed any on her mental status. She is still the same and, again by the son, this is about her regular baseline. The patient is not eating and she chooses not to. The son is aware of this. He has decided to go into hospice care at her nursing home. Her troponin is 0.07 on admission and went up to 0.08 and this is likely secondary to demand ischemia versus a troponin leak.   The patient's dementia has progressed to the point that she does not want to eat and the family understands this. We are going to discharge her to a skilled nursing facility today and we are going to cut back certain medications, blood pressure medications and her Lasix as she has borderline low blood pressure. She seems malnourished and at high risk of getting dehydrated and acute kidney failure for what I am going to stop her metformin as  well.   TIME SPENT: I spent about 45 minutes discharging this patient. See medication reconciliation list.     ____________________________ Rolette Sink, MD rsg:cs D: 05/31/2013 13:28:06 ET T: 05/31/2013 15:02:13 ET JOB#: [id number]  cc: Garwood Sink, MD, <Dictator> Otila Starn America Brown MD ELECTRONICALLY SIGNED 06/15/2013 22:55

## 2014-06-04 NOTE — H&P (Signed)
PATIENT NAME:  Molly Beasley, Molly Beasley MR#:  [id number] DATE OF BIRTH:  September 16, 1915  DATE OF ADMISSION:  05/28/2013  REFERRING PHYSICIAN: Lennette Bihari A. Paduchowski, MD  PRIMARY CARE PHYSICIAN: Dr. Efraim Kaufmann.   CHIEF COMPLAINT: Lethargy.   HISTORY OF PRESENT ILLNESS: This is a 81 year old female with a history of advanced dementia, bedridden and wheelchair-dependent at baseline. Usually, the patient is very incoherent and not able to recognize her family. The patient was brought by the nursing home for increased lethargy. Discussed over the phone, and reports the patient usually has poor mentation at baseline. The patient had CT head which did not show any acute findings. Her blood work was significant for hyponatremia of sodium at 150. He is not aware of any change in her eating or drinking habits. As well, her troponin was mildly elevated at 0.07. The patient is a poor historian and cannot give any history or report if she has or if she does not have any chest pain. The patient's urinalysis was negative, and chest x-ray did not show any acute disease. Hospitalist service was requested to admit the patient for further evaluation regarding her positive troponins. The patient's EKG was paced. She is known to have history of atrial fibrillation in the past.   PAST MEDICAL HISTORY:  1. Coronary artery disease.  2. Hypertension.  3. Sinoatrial node dysfunction.  4. Atrial fibrillation.  5. Pacemaker insertion.  6. History of DVT in the past, was on anticoagulation, discontinued as she is high fall risk.   SOCIAL HISTORY: No history of smoking or alcohol.   FAMILY HISTORY: Significant for COPD and coronary artery disease.   HOME MEDICATIONS:  1. Aspirin 81 mg daily. 2. Vicodin as needed. 3. Tylenol as needed.  4. Lisinopril 5 mg daily. 5. Digoxin 125 mcg oral daily.  6. Verapamil 240 extended release daily.  7. Divalproex sodium 125 mg oral 2 capsules 3 times a day.  8. Remeron 15 mg 1/2 tablet at  bedtime.  9. Sertraline 100 mg 1-1/2 tablet daily.  10. Zoloft 100 mg oral daily.  11. Metformin 500 mg 1/2 tablet daily.  13. Loratadine 10 mg oral at bedtime.  14. Alendronate 70 mg once a week. 16. Lasix 20 mg daily.  17. Mucinex 600 mg daily.  18. Docusate sodium 100 mg oral daily.  19. Namenda 10 mg oral daily.  20. Potassium 20 mEq oral daily.  21. Omeprazole 20 mg daily.  22. Levothyroxine 50 mcg oral daily.  23. Multivitamin daily.   REVIEW OF SYSTEMS: The patient has advanced dementia and cannot give any reliable review of systems.   PHYSICAL EXAMINATION:  VITAL SIGNS: Temperature 97.6, pulse 66, respiratory rate 16, blood pressure 162/72, saturating 96% on room.  GENERAL: Well-nourished female, looks comfortable, in no apparent distress.  HEENT: Head atraumatic, normocephalic. Pupils equally reactive to light. Pink conjunctivae. Anicteric sclerae. Dry oral mucosa, cracked lips.  NECK: Supple. No thyromegaly. No JVD.  CHEST: Good air entry bilaterally. No wheezing, rales, rhonchi.  CARDIOVASCULAR: S1, S2 heard. No rubs, murmurs or gallops.  ABDOMEN: Soft, nontender, nondistended. Bowel sounds present.  EXTREMITIES: No edema. No clubbing. No cyanosis  PSYCHIATRIC: The patient is awake, communicative, but incoherent, confused.  NEUROLOGIC: Unable to follow commands, but appears to be moving all of the upper extremities without significant deficits and lower extremity as well.  SKIN: Delayed skin turgor. Warm and dry.  MUSCULOSKELETAL: No joint effusion or erythema.  LYMPHATIC: No cervical lymphadenopathy could be appreciated.   PERTINENT  LABORATORY DATA: Glucose 152, BUN 25, creatinine 0.51, sodium 150, potassium 4.8, chloride 114, CO2 34. Troponin 0.07. White blood cells 7.3, hemoglobin 14.9, hematocrit 46.5, platelets 111. INR 1.1. Urinalysis negative for leukocyte esterase and nitrite. Chest x-ray: No active disease. CT head without contrast: No acute intracranial pathology.  EKG showing paced rhythm.   ASSESSMENT AND PLAN:  1. Lethargy and altered mental status. Actually, it is unclear how the patient is worse than her baseline. They report she was brought because she was less responsive, but as per son, the patient has very poor mentation at baseline. At this point, CT does not show any acute findings. Urinalysis is negative. Chest x-ray is negative. This is possibly related to worsening dementia. As well, most likely hypernatremia is contributing to it as well. Unclear at this point if the patient is safe to swallow. Will consult speech swallow service, and if she is okay, will encourage her to eat and drink fluids and will resume her back on her medications.  2. Positive troponins. They are mildly elevated, borderline. Will continue to cycle her cardiac enzymes, follow the trend. Will give her 1-time aspirin 300 mg per rectum and will monitor her off-unit telemetry.  3. Hypernatremia. This is due to volume depletion. Will hydrate. Will hold her Lasix.  4. Diabetes mellitus. As the patient is n.p.o., will hold her metformin.  5. Hypertension. Blood pressure is acceptable. Will hold her medications until she is able to swallow.  6. Hypothyroidism. Will resume Synthroid when she is able to swallow.  7. Atrial fibrillation, currently rate controlled, paced. Will resume back on her digoxin and Cardizem when she is able to swallow. 8. Deep venous thrombosis prophylaxis. Subcutaneous heparin.   CODE STATUS: Discussed with the son. Reports the patient is DNR.   TOTAL TIME SPENT ON ADMISSION AND PATIENT CARE: 50 minutes.   ____________________________ [name], MD dse:lb D: 05/28/2013 06:00:25 ET T: 05/28/2013 06:51:01 ET JOB#: [id number]  cc: [name], MD, <Dictator> DAWOOD Graciela Husbands MD ELECTRONICALLY SIGNED 05/28/2013 23:45
